# Patient Record
Sex: Female | Born: 1995 | Race: White | Hispanic: No | Marital: Single | Smoking: Never smoker
Health system: Southern US, Community
[De-identification: ages and names within clinical notes are randomized; demographics above are authoritative.]

## PROBLEM LIST (undated history)

## (undated) HISTORY — PX: WISDOM TOOTH EXTRACTION: SHX21

---
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
## 2015-11-24 ENCOUNTER — Ambulatory Visit (INDEPENDENT_AMBULATORY_CARE_PROVIDER_SITE_OTHER): Payer: BLUE CROSS/BLUE SHIELD | Admitting: Women's Health

## 2015-11-24 ENCOUNTER — Encounter: Payer: Self-pay | Admitting: Women's Health

## 2015-11-24 VITALS — BP 118/80 | Ht 66.0 in | Wt 126.0 lb

## 2015-11-24 DIAGNOSIS — Z01419 Encounter for gynecological examination (general) (routine) without abnormal findings: Secondary | ICD-10-CM | POA: Diagnosis not present

## 2015-11-24 DIAGNOSIS — Z3041 Encounter for surveillance of contraceptive pills: Secondary | ICD-10-CM

## 2015-11-24 LAB — CBC WITH DIFFERENTIAL/PLATELET
BASOS ABS: 73 {cells}/uL (ref 0–200)
Basophils Relative: 1 %
Eosinophils Absolute: 146 cells/uL (ref 15–500)
Eosinophils Relative: 2 %
HCT: 39.5 % (ref 35.0–45.0)
Hemoglobin: 13.3 g/dL (ref 11.7–15.5)
Lymphocytes Relative: 46 %
Lymphs Abs: 3358 cells/uL (ref 850–3900)
MCH: 29.9 pg (ref 27.0–33.0)
MCHC: 33.7 g/dL (ref 32.0–36.0)
MCV: 88.8 fL (ref 80.0–100.0)
MONOS PCT: 7 %
MPV: 8.4 fL (ref 7.5–12.5)
Monocytes Absolute: 511 cells/uL (ref 200–950)
Neutro Abs: 3212 cells/uL (ref 1500–7800)
Neutrophils Relative %: 44 %
PLATELETS: 427 10*3/uL — AB (ref 140–400)
RBC: 4.45 MIL/uL (ref 3.80–5.10)
RDW: 13 % (ref 11.0–15.0)
WBC: 7.3 10*3/uL (ref 3.8–10.8)

## 2015-11-24 MED ORDER — NORETHIN ACE-ETH ESTRAD-FE 1-20 MG-MCG PO TABS: 1.0000 | ORAL_TABLET | Freq: Every day | ORAL | Status: DC

## 2015-11-24 NOTE — Progress Notes (Deleted)
Terri Hurst October 15, 1995 [id number]    History:    Presents for annual exam.  ***  Past medical history, past surgical history, family history and social history were all reviewed and documented in the EPIC chart.  ROS:  A ROS was performed and pertinent positives and negatives are included.  Exam:  Filed Vitals:   11/24/15 0815  BP: 118/80    General appearance:  Normal Thyroid:  Symmetrical, normal in size, without palpable masses or nodularity. Respiratory  Auscultation:  Clear without wheezing or rhonchi Cardiovascular  Auscultation:  Regular rate, without rubs, murmurs or gallops  Edema/varicosities:  Not grossly evident Abdominal  Soft,nontender, without masses, guarding or rebound.  Liver/spleen:  No organomegaly noted  Hernia:  None appreciated  Skin  Inspection:  Grossly normal   Breasts: Examined lying and sitting.     Right: Without masses, retractions, discharge or axillary adenopathy.     Left: Without masses, retractions, discharge or axillary adenopathy. Gentitourinary   Inguinal/mons:  Normal without inguinal adenopathy  External genitalia:  Normal  BUS/Urethra/Skene's glands:  Normal  Vagina:  Normal  Cervix:  Normal  Uterus:  ***verted, normal in size, shape and contour.  Midline and mobile  Adnexa/parametria:     Rt: Without masses or tenderness.   Lt: Without masses or tenderness.  Anus and perineum: Normal  Digital rectal exam: Normal sphincter tone without palpated masses or tenderness  Assessment/Plan:  20 y.o.  for annual exam.   ***.    Harrington ChallengerYOUNG,NANCY J [facility]NP, 8:41 AM 11/24/2015

## 2015-11-24 NOTE — Patient Instructions (Signed)
Health Maintenance, Female Adopting a healthy lifestyle and getting preventive care can go a long way to promote health and wellness. Talk with your health care provider about what schedule of regular examinations is right for you. This is a good chance for you to check in with your provider about disease prevention and staying healthy. In between checkups, there are plenty of things you can do on your own. Experts have done a lot of research about which lifestyle changes and preventive measures are most likely to keep you healthy. Ask your health care provider for more information. WEIGHT AND DIET  Eat a healthy diet  Be sure to include plenty of vegetables, fruits, low-fat dairy products, and lean protein.  Do not eat a lot of foods high in solid fats, added sugars, or salt.  Get regular exercise. This is one of the most important things you can do for your health.  Most adults should exercise for at least 150 minutes each week. The exercise should increase your heart rate and make you sweat (moderate-intensity exercise).  Most adults should also do strengthening exercises at least twice a week. This is in addition to the moderate-intensity exercise.  Maintain a healthy weight  Body mass index (BMI) is a measurement that can be used to identify possible weight problems. It estimates body fat based on height and weight. Your health care provider can help determine your BMI and help you achieve or maintain a healthy weight.  For females 62 years of age and older:   A BMI below 18.5 is considered underweight.  A BMI of 18.5 to 24.9 is normal.  A BMI of 25 to 29.9 is considered overweight.  A BMI of 30 and above is considered obese.  Watch levels of cholesterol and blood lipids  You should start having your blood tested for lipids and cholesterol at 20 years of age, then have this test every 5 years.  You may need to have your cholesterol levels checked more often if:  Your lipid  or cholesterol levels are high.  You are older than 20 years of age.  You are at high risk for heart disease.  CANCER SCREENING   Lung Cancer  Lung cancer screening is recommended for adults 59-26 years old who are at high risk for lung cancer because of a history of smoking.  A yearly low-dose CT scan of the lungs is recommended for people who:  Currently smoke.  Have quit within the past 15 years.  Have at least a 30-pack-year history of smoking. A pack year is smoking an average of one pack of cigarettes a day for 1 year.  Yearly screening should continue until it has been 15 years since you quit.  Yearly screening should stop if you develop a health problem that would prevent you from having lung cancer treatment.  Breast Cancer  Practice breast self-awareness. This means understanding how your breasts normally appear and feel.  It also means doing regular breast self-exams. Let your health care provider know about any changes, no matter how small.  If you are in your 20s or 30s, you should have a clinical breast exam (CBE) by a health care provider every 1-3 years as part of a regular health exam.  If you are 95 or older, have a CBE every year. Also consider having a breast X-ray (mammogram) every year.  If you have a family history of breast cancer, talk to your health care provider about genetic screening.  If you  are at high risk for breast cancer, talk to your health care provider about having an MRI and a mammogram every year.  Breast cancer gene (BRCA) assessment is recommended for women who have family members with BRCA-related cancers. BRCA-related cancers include:  Breast.  Ovarian.  Tubal.  Peritoneal cancers.  Results of the assessment will determine the need for genetic counseling and BRCA1 and BRCA2 testing. Cervical Cancer Your health care provider may recommend that you be screened regularly for cancer of the pelvic organs (ovaries, uterus, and  vagina). This screening involves a pelvic examination, including checking for microscopic changes to the surface of your cervix (Pap test). You may be encouraged to have this screening done every 3 years, beginning at age 69.  For women ages 26-65, health care providers may recommend pelvic exams and Pap testing every 3 years, or they may recommend the Pap and pelvic exam, combined with testing for human papilloma virus (HPV), every 5 years. Some types of HPV increase your risk of cervical cancer. Testing for HPV may also be done on women of any age with unclear Pap test results.  Other health care providers may not recommend any screening for nonpregnant women who are considered low risk for pelvic cancer and who do not have symptoms. Ask your health care provider if a screening pelvic exam is right for you.  If you have had past treatment for cervical cancer or a condition that could lead to cancer, you need Pap tests and screening for cancer for at least 20 years after your treatment. If Pap tests have been discontinued, your risk factors (such as having a new sexual partner) need to be reassessed to determine if screening should resume. Some women have medical problems that increase the chance of getting cervical cancer. In these cases, your health care provider may recommend more frequent screening and Pap tests. Colorectal Cancer  This type of cancer can be detected and often prevented.  Routine colorectal cancer screening usually begins at 20 years of age and continues through 20 years of age.  Your health care provider may recommend screening at an earlier age if you have risk factors for colon cancer.  Your health care provider may also recommend using home test kits to check for hidden blood in the stool.  A small camera at the end of a tube can be used to examine your colon directly (sigmoidoscopy or colonoscopy). This is done to check for the earliest forms of colorectal  cancer.  Routine screening usually begins at age 51.  Direct examination of the colon should be repeated every 5-10 years through 20 years of age. However, you may need to be screened more often if early forms of precancerous polyps or small growths are found. Skin Cancer  Check your skin from head to toe regularly.  Tell your health care provider about any new moles or changes in moles, especially if there is a change in a mole's shape or color.  Also tell your health care provider if you have a mole that is larger than the size of a pencil eraser.  Always use sunscreen. Apply sunscreen liberally and repeatedly throughout the day.  Protect yourself by wearing long sleeves, pants, a wide-brimmed hat, and sunglasses whenever you are outside. HEART DISEASE, DIABETES, AND HIGH BLOOD PRESSURE   High blood pressure causes heart disease and increases the risk of stroke. High blood pressure is more likely to develop in:  People who have blood pressure in the high end  of the normal range (130-139/85-89 mm Hg).  People who are overweight or obese.  People who are African American.  If you are 38-23 years of age, have your blood pressure checked every 3-5 years. If you are 61 years of age or older, have your blood pressure checked every year. You should have your blood pressure measured twice--once when you are at a hospital or clinic, and once when you are not at a hospital or clinic. Record the average of the two measurements. To check your blood pressure when you are not at a hospital or clinic, you can use:  An automated blood pressure machine at a pharmacy.  A home blood pressure monitor.  If you are between 45 years and 39 years old, ask your health care provider if you should take aspirin to prevent strokes.  Have regular diabetes screenings. This involves taking a blood sample to check your fasting blood sugar level.  If you are at a normal weight and have a low risk for diabetes,  have this test once every three years after 20 years of age.  If you are overweight and have a high risk for diabetes, consider being tested at a younger age or more often. PREVENTING INFECTION  Hepatitis B  If you have a higher risk for hepatitis B, you should be screened for this virus. You are considered at high risk for hepatitis B if:  You were born in a country where hepatitis B is common. Ask your health care provider which countries are considered high risk.  Your parents were born in a high-risk country, and you have not been immunized against hepatitis B (hepatitis B vaccine).  You have HIV or AIDS.  You use needles to inject street drugs.  You live with someone who has hepatitis B.  You have had sex with someone who has hepatitis B.  You get hemodialysis treatment.  You take certain medicines for conditions, including cancer, organ transplantation, and autoimmune conditions. Hepatitis C  Blood testing is recommended for:  Everyone born from [birth year] through 1965.  Anyone with known risk factors for hepatitis C. Sexually transmitted infections (STIs)  You should be screened for sexually transmitted infections (STIs) including gonorrhea and chlamydia if:  You are sexually active and are younger than 20 years of age.  You are older than 20 years of age and your health care provider tells you that you are at risk for this type of infection.  Your sexual activity has changed since you were last screened and you are at an increased risk for chlamydia or gonorrhea. Ask your health care provider if you are at risk.  If you do not have HIV, but are at risk, it may be recommended that you take a prescription medicine daily to prevent HIV infection. This is called pre-exposure prophylaxis (PrEP). You are considered at risk if:  You are sexually active and do not regularly use condoms or know the HIV status of your partner(s).  You take drugs by injection.  You are sexually  active with a partner who has HIV. Talk with your health care provider about whether you are at high risk of being infected with HIV. If you choose to begin PrEP, you should first be tested for HIV. You should then be tested every 3 months for as long as you are taking PrEP.  PREGNANCY   If you are premenopausal and you may become pregnant, ask your health care provider about preconception counseling.  If you may  become pregnant, take 400 to 800 micrograms (mcg) of folic acid every day.  If you want to prevent pregnancy, talk to your health care provider about birth control (contraception). OSTEOPOROSIS AND MENOPAUSE   Osteoporosis is a disease in which the bones lose minerals and strength with aging. This can result in serious bone fractures. Your risk for osteoporosis can be identified using a bone density scan.  If you are 28 years of age or older, or if you are at risk for osteoporosis and fractures, ask your health care provider if you should be screened.  Ask your health care provider whether you should take a calcium or vitamin D supplement to lower your risk for osteoporosis.  Menopause may have certain physical symptoms and risks.  Hormone replacement therapy may reduce some of these symptoms and risks. Talk to your health care provider about whether hormone replacement therapy is right for you.  HOME CARE INSTRUCTIONS   Schedule regular health, dental, and eye exams.  Stay current with your immunizations.   Do not use any tobacco products including cigarettes, chewing tobacco, or electronic cigarettes.  If you are pregnant, do not drink alcohol.  If you are breastfeeding, limit how much and how often you drink alcohol.  Limit alcohol intake to no more than 1 drink per day for nonpregnant women. One drink equals 12 ounces of beer, 5 ounces of wine, or 1 ounces of hard liquor.  Do not use street drugs.  Do not share needles.  Ask your health care provider for help if  you need support or information about quitting drugs.  Tell your health care provider if you often feel depressed.  Tell your health care provider if you have ever been abused or do not feel safe at home.   This information is not intended to replace advice given to you by your health care provider. Make sure you discuss any questions you have with your health care provider.   Document Released: 02/15/2011 Document Revised: 08/23/2014 Document Reviewed: 07/04/2013 Elsevier Interactive Patient Education [employer].

## 2015-11-24 NOTE — Progress Notes (Signed)
Terri GeneralMegan N Hurst 06-Sep-1995 [id number]    History:    Presents for annual exam.  Monthly Cycle on Loestrin without complaint. 2015 right breast fibroadenoma, initially 4 cm, that has gotten smaller, confirmed on ultrasound. First partner both. Gardasil series completed. Treated for UTI 2 weeks ago.  Past medical history, past surgical history, family history and social history were all reviewed and documented in the EPIC chart. [employer] doing well, [employer] is goal. Parents healthy.  ROS:  A ROS was performed and pertinent positives and negatives are included.  Exam:  Filed Vitals:   11/24/15 0815  BP: 118/80    Hurst appearance:  Normal Thyroid:  Symmetrical, normal in size, without palpable masses or nodularity. Respiratory  Auscultation:  Clear without wheezing or rhonchi Cardiovascular  Auscultation:  Regular rate, without rubs, murmurs or gallops  Edema/varicosities:  Not grossly evident Abdominal  Soft,nontender, without masses, guarding or rebound.  Liver/spleen:  No organomegaly noted  Hernia:  None appreciated  Skin  Inspection:  Grossly normal   Breasts: Examined lying and sitting.     Right: Right mobile 3 cm nontender fibroadenoma, discharge or axillary adenopathy.     Left: Without masses, retractions, discharge or axillary adenopathy. Gentitourinary   Inguinal/mons:  Normal without inguinal adenopathy  External genitalia:  Normal  BUS/Urethra/Skene's glands:  Normal  Vagina:  Normal  Cervix:  Normal  Uterus:  normal in size, shape and contour.  Midline and mobile  Adnexa/parametria:     Rt: Without masses or tenderness.   Lt: Without masses or tenderness.  Anus and perineum: Normal    Assessment/Plan:  20 y.o. S WF G0  for annual exam with no complaints.  Monthly cycle on Loestrin/same partner Right breast fibroadenoma /getting smaller  Plan: Loestrin 1/20 prescription, proper use given and reviewed slight risk for blood clots. Continue condoms  until permanent partner. SBE's, call or return to office if changes, calcium rich diet, exercise, MVI daily encouraged. Campus safety reviewed. CBC, UHarrington Hurst.   Hurst,Terri J Kings Daughters Medical CenterWHNP, 8:49 AM 11/24/2015

## 2015-12-05 ENCOUNTER — Other Ambulatory Visit: Payer: Self-pay | Admitting: Women's Health

## 2016-02-05 ENCOUNTER — Encounter: Payer: Self-pay | Admitting: Women's Health

## 2016-02-05 ENCOUNTER — Ambulatory Visit (INDEPENDENT_AMBULATORY_CARE_PROVIDER_SITE_OTHER): Payer: BLUE CROSS/BLUE SHIELD | Admitting: Women's Health

## 2016-02-05 VITALS — BP 117/82

## 2016-02-05 DIAGNOSIS — Z113 Encounter for screening for infections with a predominantly sexual mode of transmission: Secondary | ICD-10-CM | POA: Diagnosis not present

## 2016-02-05 DIAGNOSIS — N760 Acute vaginitis: Secondary | ICD-10-CM

## 2016-02-05 DIAGNOSIS — B9689 Other specified bacterial agents as the cause of diseases classified elsewhere: Secondary | ICD-10-CM

## 2016-02-05 DIAGNOSIS — A499 Bacterial infection, unspecified: Secondary | ICD-10-CM

## 2016-02-05 LAB — WET PREP FOR TRICH, YEAST, CLUE
TRICH WET PREP: NONE SEEN
YEAST WET PREP: NONE SEEN

## 2016-02-05 MED ORDER — METRONIDAZOLE 500 MG PO TABS: 500.0000 mg | ORAL_TABLET | Freq: Two times a day (BID) | ORAL | Status: DC

## 2016-02-05 NOTE — Progress Notes (Signed)
Patient ID: Emeline GeneralMegan N Spraker, female   DOB: 12-05-1995, 20 y.o.   MRN: [id number]  Presents with complaints of intermittent white/yellow discharge for one month that has worsened over the past week, odor x1week.  Denies abdominal pain, vaginal itching, or urinary symptoms.  New partner during past month, reports symptoms began prior to new partner.   Monthly cycles on Loestrin/condoms.    Exam: Well-appearing   External genitalia within normal limits.  Speculum exam no erythema, white discharge present.  Wet prep positive for few clue cells, many WBCs, many bacteria. GC/Chlamydia culture taken.. No CMT or tenderness with exam  Bacterial vaginosis STD screen  Plan: Metronidazole 500mg  one tablet po x7days, alcohol precautions given.  Reviewed infection prevention measures.  HIV, hepatitis B and C, RPR. GC/Chlamydia culture pending. Reviewed importance of condoms until permanent partner.

## 2016-02-05 NOTE — Patient Instructions (Signed)

## 2016-02-06 ENCOUNTER — Other Ambulatory Visit: Payer: Self-pay | Admitting: Women's Health

## 2016-02-06 LAB — GC/CHLAMYDIA PROBE AMP
CT Probe RNA: DETECTED — AB
GC PROBE AMP APTIMA: NOT DETECTED

## 2016-02-06 LAB — RPR

## 2016-02-06 LAB — HEPATITIS C ANTIBODY: HCV AB: NEGATIVE

## 2016-02-06 LAB — HEPATITIS B SURFACE ANTIGEN: HEP B S AG: NEGATIVE

## 2016-02-06 LAB — HIV ANTIBODY (ROUTINE TESTING W REFLEX): HIV 1&2 Ab, 4th Generation: NONREACTIVE

## 2016-02-06 MED ORDER — AZITHROMYCIN 500 MG PO TABS: 1000.0000 mg | ORAL_TABLET | Freq: Once | ORAL | Status: DC

## 2016-02-19 ENCOUNTER — Telehealth: Payer: Self-pay

## 2016-02-19 NOTE — Telephone Encounter (Signed)
I called patient because she has not scheduled TOC appt from [facility] on 02/06/16 she was treated.  I spoke with her and reminded her about this and she scheduled appt for 03/11/16 at 3:30pm.

## 2016-03-11 ENCOUNTER — Ambulatory Visit (INDEPENDENT_AMBULATORY_CARE_PROVIDER_SITE_OTHER): Payer: BLUE CROSS/BLUE SHIELD | Admitting: Women's Health

## 2016-03-11 ENCOUNTER — Encounter: Payer: Self-pay | Admitting: Women's Health

## 2016-03-11 DIAGNOSIS — Z113 Encounter for screening for infections with a predominantly sexual mode of transmission: Secondary | ICD-10-CM | POA: Diagnosis not present

## 2016-03-11 DIAGNOSIS — A749 Chlamydial infection, unspecified: Secondary | ICD-10-CM | POA: Diagnosis not present

## 2016-03-11 NOTE — Patient Instructions (Signed)
Chlamydia, Female Chlamydia is an infection. It is spread through sexual contact. Chlamydia can be in different areas of the body. These areas include the cervix, urethra, throat, or rectum. You may not know you have chlamydia because many people never develop the symptoms. Chlamydia is not difficult to treat once you know you have it. However, if it is left untreated, chlamydia can lead to more serious health problems.  CAUSES  Chlamydia is caused by bacteria. It is a sexually transmitted disease. It is passed from an infected partner during intimate contact. This contact could be with the genitals, mouth, or rectal area. Chlamydia can also be passed from mothers to babies during birth. SIGNS AND SYMPTOMS  There may not be any symptoms. This is often the case early in the infection. If symptoms develop, they may include:  Mild pain and discomfort when urinating.  Redness, soreness, and swelling (inflammation) of the rectum.  Vaginal discharge.  Painful intercourse.  Abdominal pain.  Bleeding between menstrual periods. DIAGNOSIS  To diagnose this infection, your health care provider will do a pelvic exam. Cultures will be taken of the vagina, cervix, urine, and possibly the rectum to verify the diagnosis.  TREATMENT You will be given antibiotic medicines. If you are pregnant, certain types of antibiotics will need to be avoided. Any sexual partners should also be treated, even if they do not show symptoms. Your health care provider may test you for infection again 3 months after treatment. HOME CARE INSTRUCTIONS   Take your antibiotic medicine as directed by your health care provider. Finish the antibiotic even if you start to feel better.  Take medicines only as directed by your health care provider.  Inform any sexual partners about the infection. They should also be treated.  Do not have sexual contact until your health care provider tells you it is okay.  Get plenty of  rest.  Eat a well-balanced diet.  Drink enough fluids to keep your urine clear or pale yellow.  Keep all follow-up visits as directed by your health care provider. SEEK MEDICAL CARE IF:  You have painful urination.  You have abdominal pain.  You have vaginal discharge.  You have painful sexual intercourse.  You have bleeding between periods and after sex.  You have a fever. SEEK IMMEDIATE MEDICAL CARE IF:   You experience nausea or vomiting.  You experience excessive sweating (diaphoresis).  You have difficulty swallowing.   This information is not intended to replace advice given to you by your health care provider. Make sure you discuss any questions you have with your health care provider.   Document Released: 05/12/2005 Document Revised: 04/23/2015 Document Reviewed: 04/09/2013 Elsevier Interactive Patient Education 2016 Elsevier Inc.  

## 2016-03-11 NOTE — Progress Notes (Signed)
Presents for test of cure chlamydia. Diagnosed with chlamydia 02/05/2016 treated with Zithromax, has abstained, partner informed. Negative HIV, hepatitis, RPR. Contraceptives on Loestrin. Denies discharge, urinary symptoms, abdominal pain or fever.  Exam: Appears well. External genitalia within normal limits, speculum exam scant discharge no odor or erythema noted, GC/Chlamydia culture taken. Bimanual no CMT or tenderness.  Test of cure chlamydia  Plan: GC/Chlamydia culture pending. Reviewed importance of condoms until permanent partner or abstinence.

## 2016-03-12 LAB — GC/CHLAMYDIA PROBE AMP
CT Probe RNA: NOT DETECTED
GC Probe RNA: NOT DETECTED

## 2016-05-25 ENCOUNTER — Ambulatory Visit (INDEPENDENT_AMBULATORY_CARE_PROVIDER_SITE_OTHER): Payer: BLUE CROSS/BLUE SHIELD | Admitting: Women's Health

## 2016-05-25 ENCOUNTER — Other Ambulatory Visit: Payer: Self-pay | Admitting: Women's Health

## 2016-05-25 ENCOUNTER — Encounter: Payer: Self-pay | Admitting: Women's Health

## 2016-05-25 VITALS — BP 124/80 | Ht 66.0 in | Wt 126.0 lb

## 2016-05-25 DIAGNOSIS — R350 Frequency of micturition: Secondary | ICD-10-CM

## 2016-05-25 DIAGNOSIS — N898 Other specified noninflammatory disorders of vagina: Secondary | ICD-10-CM

## 2016-05-25 DIAGNOSIS — N3000 Acute cystitis without hematuria: Secondary | ICD-10-CM

## 2016-05-25 LAB — URINALYSIS W MICROSCOPIC + REFLEX CULTURE
Bilirubin Urine: NEGATIVE
CASTS: NONE SEEN [LPF]
CRYSTALS: NONE SEEN [HPF]
Glucose, UA: NEGATIVE
Ketones, ur: NEGATIVE
NITRITE: NEGATIVE
PROTEIN: NEGATIVE
SPECIFIC GRAVITY, URINE: 1.02 (ref 1.001–1.035)
YEAST: NONE SEEN [HPF]
pH: 7 (ref 5.0–8.0)

## 2016-05-25 LAB — WET PREP FOR TRICH, YEAST, CLUE
Clue Cells Wet Prep HPF POC: NONE SEEN
TRICH WET PREP: NONE SEEN
YEAST WET PREP: NONE SEEN

## 2016-05-25 MED ORDER — SULFAMETHOXAZOLE-TRIMETHOPRIM 800-160 MG PO TABS: 1.0000 | ORAL_TABLET | Freq: Two times a day (BID) | ORAL | 0 refills | Status: DC

## 2016-05-25 NOTE — Patient Instructions (Signed)

## 2016-05-25 NOTE — Progress Notes (Signed)
Presents with complaint of increased pain and burning with urination, increased symptoms at end of stream of urination but is not always for the past week, Increased symptoms the longer she postpones urination with a full bladder. Denies nocturia, nausea, fever, or change in discharge. Same partner with negative STD screen. Contraceptives on Loestrin.  Exam: Appears well. UA: Trace blood, trace leukocytes, 10-20 WBCs, 6-10 squamous epithelials moderate bacteria. External genitalia within normal limits, speculum exam moderate amount of white discharge without erythema or odor, wet prep negative.  Probable UTI  Plan: Septra twice daily for 3 days #6, prescription, proper use given and reviewed. Urine culture pending. Instructed to call if no relief of symptoms. UTI prevention discussed.

## 2016-05-27 LAB — URINE CULTURE: Colony Count: >=100000

## 2016-10-13 ENCOUNTER — Other Ambulatory Visit: Payer: Self-pay | Admitting: Women's Health

## 2016-12-29 ENCOUNTER — Encounter: Payer: Self-pay | Admitting: Gynecology

## 2017-01-01 ENCOUNTER — Other Ambulatory Visit: Payer: Self-pay | Admitting: Women's Health

## 2017-01-03 ENCOUNTER — Telehealth: Payer: Self-pay | Admitting: *Deleted

## 2017-01-03 NOTE — Telephone Encounter (Signed)
Pt called stating she doesn't like new generic pills prescribed, has annual scheduled on 01/11/17. Will discuss with nancy at this appointment about switching pills.

## 2017-01-11 ENCOUNTER — Encounter: Payer: BLUE CROSS/BLUE SHIELD | Admitting: Women's Health

## 2017-01-11 DIAGNOSIS — Z0289 Encounter for other administrative examinations: Secondary | ICD-10-CM

## 2017-02-01 ENCOUNTER — Ambulatory Visit (INDEPENDENT_AMBULATORY_CARE_PROVIDER_SITE_OTHER): Payer: BLUE CROSS/BLUE SHIELD | Admitting: Women's Health

## 2017-02-01 ENCOUNTER — Encounter: Payer: Self-pay | Admitting: Women's Health

## 2017-02-01 VITALS — BP 100/60 | Ht 66.75 in | Wt 125.0 lb

## 2017-02-01 DIAGNOSIS — Z113 Encounter for screening for infections with a predominantly sexual mode of transmission: Secondary | ICD-10-CM | POA: Diagnosis not present

## 2017-02-01 DIAGNOSIS — Z30015 Encounter for initial prescription of vaginal ring hormonal contraceptive: Secondary | ICD-10-CM | POA: Diagnosis not present

## 2017-02-01 DIAGNOSIS — Z01419 Encounter for gynecological examination (general) (routine) without abnormal findings: Secondary | ICD-10-CM | POA: Diagnosis not present

## 2017-02-01 LAB — CBC WITH DIFFERENTIAL/PLATELET
BASOS PCT: 0 %
Basophils Absolute: 0 cells/uL (ref 0–200)
Eosinophils Absolute: 210 cells/uL (ref 15–500)
Eosinophils Relative: 3 %
HCT: 39.1 % (ref 35.0–45.0)
Hemoglobin: 12.9 g/dL (ref 11.7–15.5)
LYMPHS PCT: 37 %
Lymphs Abs: 2590 cells/uL (ref 850–3900)
MCH: 29.5 pg (ref 27.0–33.0)
MCHC: 33 g/dL (ref 32.0–36.0)
MCV: 89.5 fL (ref 80.0–100.0)
MONOS PCT: 7 %
MPV: 9.1 fL (ref 7.5–12.5)
Monocytes Absolute: 490 cells/uL (ref 200–950)
NEUTROS PCT: 53 %
Neutro Abs: 3710 cells/uL (ref 1500–7800)
PLATELETS: 337 10*3/uL (ref 140–400)
RBC: 4.37 MIL/uL (ref 3.80–5.10)
RDW: 12.8 % (ref 11.0–15.0)
WBC: 7 10*3/uL (ref 3.8–10.8)

## 2017-02-01 MED ORDER — ETONOGESTREL-ETHINYL ESTRADIOL 0.12-0.015 MG/24HR VA RING: VAGINAL_RING | VAGINAL | 4 refills | Status: DC

## 2017-02-01 NOTE — Progress Notes (Signed)
Terri GeneralMegan N Borum 01/07/1996 [id number]    History:    Presents for annual exam.  Monthly cycle on Loestrin but occasionally forgetting. 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 Gardasil series completed. New partner.   Past medical history, past surgical history, family history and social history were all reviewed and documented in the EPIC chart. [employer] at [employer], [place]. Parents healthy.  ROS:  A ROS was performed and pertinent positives and negatives are included.  Exam:  Vitals:   02/01/17 1556  BP: 100/60  Weight: 125 lb (56.7 kg)  Height: 5' 6.75" (1.695 m)   Body mass index is 19.72 kg/m.   Hurst appearance:  Normal Thyroid:  Symmetrical, normal in size, without palpable masses or nodularity. Respiratory  Auscultation:  Clear without wheezing or rhonchi Cardiovascular  Auscultation:  Regular rate, without rubs, murmurs or gallops  Edema/varicosities:  Not grossly evident Abdominal  Soft,nontender, without masses, guarding or rebound.  Liver/spleen:  No organomegaly noted  Hernia:  None appreciated  Skin  Inspection:  Grossly normal   Breasts: Examined lying and sitting.     Right: Without masses, retractions, discharge or axillary adenopathy.     Left: Without masses, retractions, discharge or axillary adenopathy. Gentitourinary   Inguinal/mons:  Normal without inguinal adenopathy  External genitalia:  Normal  BUS/Urethra/Skene's glands:  Normal  Vagina:  Normal  Cervix:  Normal  Uterus:  normal in size, shape and contour.  Midline and mobile  Adnexa/parametria:     Rt: Without masses or tenderness.   Lt: Without masses or tenderness.  Anus and perineum: Normal  Assessment/Plan:  21 y.o. S WF G0  for annual exam with no complaints.  Contraception management STD screen Right breast fibroadenoma/shrinking  Plan: Contraception options reviewed will try NuvaRing prescription, proper use, slight risk for blood clots and strokes reviewed.  Instructed to call if problems with change. Aware that it is effective for up to 4 weeks. SBE's, exercise, calcium rich diet, MVI daily encouraged. Campus and dating safety reviewed. CBC, GC/Chlamydia, HIV, hep B, C, RPR.  Harrington Challengerancy J Young [facility] SystemWHNP, 5:08 PM 02/01/2017

## 2017-02-01 NOTE — Patient Instructions (Signed)

## 2017-02-02 ENCOUNTER — Other Ambulatory Visit: Payer: Self-pay | Admitting: Women's Health

## 2017-02-02 LAB — GC/CHLAMYDIA PROBE AMP
CT PROBE, AMP APTIMA: DETECTED — AB
GC Probe RNA: NOT DETECTED

## 2017-02-02 LAB — HEPATITIS B SURFACE ANTIGEN: Hepatitis B Surface Ag: NEGATIVE

## 2017-02-02 LAB — RPR

## 2017-02-02 LAB — HIV ANTIBODY (ROUTINE TESTING W REFLEX): HIV: NONREACTIVE

## 2017-02-02 LAB — HEPATITIS C ANTIBODY: HCV AB: NEGATIVE

## 2017-02-02 MED ORDER — AZITHROMYCIN 500 MG PO TABS: 1000.0000 mg | ORAL_TABLET | Freq: Every day | ORAL | 0 refills | Status: DC

## 2017-02-08 ENCOUNTER — Encounter: Payer: BLUE CROSS/BLUE SHIELD | Admitting: Women's Health

## 2017-02-22 ENCOUNTER — Ambulatory Visit: Payer: BLUE CROSS/BLUE SHIELD | Admitting: Gynecology

## 2017-03-15 ENCOUNTER — Ambulatory Visit: Payer: BLUE CROSS/BLUE SHIELD | Admitting: Women's Health

## 2017-05-25 DIAGNOSIS — F4310 Post-traumatic stress disorder, unspecified: Secondary | ICD-10-CM | POA: Diagnosis not present

## 2017-06-07 DIAGNOSIS — F4310 Post-traumatic stress disorder, unspecified: Secondary | ICD-10-CM | POA: Diagnosis not present

## 2017-06-16 DIAGNOSIS — F4310 Post-traumatic stress disorder, unspecified: Secondary | ICD-10-CM | POA: Diagnosis not present

## 2017-06-16 DIAGNOSIS — F4323 Adjustment disorder with mixed anxiety and depressed mood: Secondary | ICD-10-CM | POA: Diagnosis not present

## 2017-07-08 DIAGNOSIS — J029 Acute pharyngitis, unspecified: Secondary | ICD-10-CM | POA: Diagnosis not present

## 2017-08-01 ENCOUNTER — Telehealth: Payer: Self-pay | Admitting: *Deleted

## 2017-08-01 NOTE — Telephone Encounter (Signed)
Spoke with patient c/o vaginal discharge and vaginal itching and irritation. Transferred to front desk to schedule OV with provider.

## 2017-08-01 NOTE — Telephone Encounter (Signed)
Patient called left message in triage voicemail c/o vaginal itching, asked me to call her. I called and told pt to call me back.

## 2017-08-03 ENCOUNTER — Encounter: Payer: Self-pay | Admitting: Women's Health

## 2017-08-03 ENCOUNTER — Ambulatory Visit (INDEPENDENT_AMBULATORY_CARE_PROVIDER_SITE_OTHER): Payer: BLUE CROSS/BLUE SHIELD | Admitting: Women's Health

## 2017-08-03 VITALS — BP 110/78

## 2017-08-03 DIAGNOSIS — R300 Dysuria: Secondary | ICD-10-CM | POA: Diagnosis not present

## 2017-08-03 DIAGNOSIS — B373 Candidiasis of vulva and vagina: Secondary | ICD-10-CM

## 2017-08-03 DIAGNOSIS — B3731 Acute candidiasis of vulva and vagina: Secondary | ICD-10-CM

## 2017-08-03 LAB — WET PREP FOR TRICH, YEAST, CLUE

## 2017-08-03 MED ORDER — FLUCONAZOLE 150 MG PO TABS: 150.0000 mg | ORAL_TABLET | Freq: Once | ORAL | 1 refills | Status: AC

## 2017-08-03 NOTE — Patient Instructions (Signed)

## 2017-08-03 NOTE — Progress Notes (Signed)
21 year old S WF G0 presents with complaint of vaginal irritation with itching and discomfort for the past few days. Was treated for strep throat with amoxicillin several weeks ago at primary care. Mild urinary burning, frequency without pain at end of stream of urination. Denies abdominal pain or fever. Monthly cycle on NuvaRing. Not sexually active, negative STD screen with past partner. [employer] hoping to graduate in May.  Exam: Appears well. External genitalia extremely erythematous at introitus, speculum exam vaginal walls erythematous, moderate amount of a curdy discharge noted, wet prep positive for yeast. UA: Negative nitrites, negative leukocytes, negative blood, 0-5 WBCs, few bacteria.  Yeast vaginitis  Plan: Diflucan 150 by mouth 1 dose, 1 refill, yeast prevention discussed, instructed to call if no relief of symptoms.

## 2017-08-04 LAB — URINALYSIS W MICROSCOPIC + REFLEX CULTURE
Bilirubin Urine: NEGATIVE
Glucose, UA: NEGATIVE
HGB URINE DIPSTICK: NEGATIVE
HYALINE CAST: NONE SEEN /LPF
KETONES UR: NEGATIVE
LEUKOCYTE ESTERASE: NEGATIVE
Nitrites, Initial: NEGATIVE
PROTEIN: NEGATIVE
RBC / HPF: NONE SEEN /HPF (ref 0–2)
Specific Gravity, Urine: 1.022 (ref 1.001–1.03)
pH: 5.5 (ref 5.0–8.0)

## 2017-08-04 LAB — NO CULTURE INDICATED

## 2018-02-06 ENCOUNTER — Encounter: Payer: BLUE CROSS/BLUE SHIELD | Admitting: Women's Health

## 2018-02-06 DIAGNOSIS — Z0289 Encounter for other administrative examinations: Secondary | ICD-10-CM

## 2018-03-09 ENCOUNTER — Other Ambulatory Visit: Payer: Self-pay | Admitting: Women's Health

## 2018-03-09 DIAGNOSIS — Z30015 Encounter for initial prescription of vaginal ring hormonal contraceptive: Secondary | ICD-10-CM

## 2018-04-12 ENCOUNTER — Ambulatory Visit (INDEPENDENT_AMBULATORY_CARE_PROVIDER_SITE_OTHER): Payer: BLUE CROSS/BLUE SHIELD | Admitting: Women's Health

## 2018-04-12 ENCOUNTER — Encounter: Payer: Self-pay | Admitting: Women's Health

## 2018-04-12 ENCOUNTER — Other Ambulatory Visit: Payer: Self-pay | Admitting: Women's Health

## 2018-04-12 VITALS — BP 120/78

## 2018-04-12 DIAGNOSIS — N3000 Acute cystitis without hematuria: Secondary | ICD-10-CM | POA: Diagnosis not present

## 2018-04-12 DIAGNOSIS — Z30015 Encounter for initial prescription of vaginal ring hormonal contraceptive: Secondary | ICD-10-CM | POA: Diagnosis not present

## 2018-04-12 DIAGNOSIS — R300 Dysuria: Secondary | ICD-10-CM | POA: Diagnosis not present

## 2018-04-12 MED ORDER — SULFAMETHOXAZOLE-TRIMETHOPRIM 800-160 MG PO TABS: 1.0000 | ORAL_TABLET | Freq: Two times a day (BID) | ORAL | 0 refills | Status: DC

## 2018-04-12 MED ORDER — ETONOGESTREL-ETHINYL ESTRADIOL 0.12-0.015 MG/24HR VA RING: VAGINAL_RING | VAGINAL | 0 refills | Status: DC

## 2018-04-12 NOTE — Patient Instructions (Signed)

## 2018-04-12 NOTE — Progress Notes (Signed)
22 year old S WF G0 presents with complaint of increased urinary frequency with urgency, burning, bladder pressure and discomfort for the last few days that is getting worse.  Denies vaginal discharge, itching or odor, changes in GI or fever.  Has not been sexually active in many months, denies need for STD screen.  Contraception on NuvaRing without complaint.  Attending school at [employer], had a semester abroad this past summer in [place].  Currently on menstrual cycle.   Exam: Appears well.  No CVAT. UA:  pH greater than 9, +1 leukocytes, +3 blood, 40-60 WBCs, greater than 60 RBCs, 6-10 squamous epithelials, many bacteria  UTI  Plan: Septra twice daily for 3 days prescription, proper use given and reviewed,  UTI prevention discussed.  Instructed to call if no relief.  Reviewed importance of increasing water.  Keep scheduled annual exam office visit.

## 2018-04-13 LAB — URINE CULTURE
MICRO NUMBER:: 91030113
SPECIMEN QUALITY:: ADEQUATE

## 2018-04-18 ENCOUNTER — Encounter: Payer: Self-pay | Admitting: Women's Health

## 2018-04-18 ENCOUNTER — Ambulatory Visit: Payer: BLUE CROSS/BLUE SHIELD | Admitting: Women's Health

## 2018-04-18 VITALS — BP 110/78 | Ht 67.0 in | Wt 130.0 lb

## 2018-04-18 DIAGNOSIS — Z113 Encounter for screening for infections with a predominantly sexual mode of transmission: Secondary | ICD-10-CM

## 2018-04-18 DIAGNOSIS — Z01419 Encounter for gynecological examination (general) (routine) without abnormal findings: Secondary | ICD-10-CM | POA: Diagnosis not present

## 2018-04-18 DIAGNOSIS — Z30015 Encounter for initial prescription of vaginal ring hormonal contraceptive: Secondary | ICD-10-CM

## 2018-04-18 MED ORDER — ETONOGESTREL-ETHINYL ESTRADIOL 0.12-0.015 MG/24HR VA RING: VAGINAL_RING | VAGINAL | 4 refills | Status: DC

## 2018-04-18 NOTE — Patient Instructions (Signed)

## 2018-04-18 NOTE — Progress Notes (Signed)
Terri Hurst [birth year] [id number]    History:    Presents for annual exam.  Light monthly cycle on NuvaRing without complaint.  Currently not sexually active but  requested STD screen.  Gardasil series completed.  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  Was treated for UTI last week and has had good symptom relief.  Past medical history, past surgical history, family history and social history were all reviewed and documented in the EPIC chart.  Senior at [employer].  Parents healthy.  ROS:  A ROS was performed and pertinent positives and negatives are included.  Exam:  Vitals:   04/18/18 1029  BP: 110/78  Weight: 130 lb (59 kg)  Height: 5\' 7"  (1.702 m)   Body mass index is 20.36 kg/m.   General appearance:  Normal Thyroid:  Symmetrical, normal in size, without palpable masses or nodularity. Respiratory  Auscultation:  Clear without wheezing or rhonchi Cardiovascular  Auscultation:  Regular rate, without rubs, murmurs or gallops  Edema/varicosities:  Not grossly evident Abdominal  Soft,nontender, without masses, guarding or rebound.  Liver/spleen:  No organomegaly noted  Hernia:  None appreciated  Skin  Inspection:  Grossly normal   Breasts: Examined lying and sitting.     Right: Without masses, retractions, discharge or axillary adenopathy.     Left: Without masses, retractions, discharge or axillary adenopathy. Gentitourinary   Inguinal/mons:  Normal without inguinal adenopathy  External genitalia:  Normal  BUS/Urethra/Skene's glands:  Normal  Vagina:  Normal  Cervix:  Normal  Uterus:  normal in size, shape and contour.  Midline and mobile  Adnexa/parametria:     Rt: Without masses or tenderness.   Lt: Without masses or tenderness.  Anus and perineum: Normal  Digital rectal exam: Normal sphincter tone without palpated masses or tenderness  Assessment/Plan:  22 y.o. S WF G0 for annual exam with no complaints.  Monthly cycle on NuvaRing 2015 right breast  fibroadenoma/smaller STD screen  Plan: NuvaRing prescription, proper use given and reviewed slight risk for blood clots and strokes.  Condoms encouraged until permanent partner.  SBE's, exercise, calcium rich foods, MVI daily encouraged.  Campus safety discussed.  CBC, GC/chlamydia, HIV, hep B, C, RPR.  Pap.    Harrington Challenger [facility], 1:15 PM 04/18/2018

## 2018-04-19 LAB — RPR: RPR: NONREACTIVE

## 2018-04-19 LAB — CBC WITH DIFFERENTIAL/PLATELET
BASOS ABS: 61 {cells}/uL (ref 0–200)
Basophils Relative: 0.8 %
EOS ABS: 122 {cells}/uL (ref 15–500)
EOS PCT: 1.6 %
HEMATOCRIT: 38.7 % (ref 35.0–45.0)
HEMOGLOBIN: 13 g/dL (ref 11.7–15.5)
LYMPHS ABS: 2082 {cells}/uL (ref 850–3900)
MCH: 30.2 pg (ref 27.0–33.0)
MCHC: 33.6 g/dL (ref 32.0–36.0)
MCV: 90 fL (ref 80.0–100.0)
MPV: 9.6 fL (ref 7.5–12.5)
Monocytes Relative: 7.5 %
NEUTROS ABS: 4765 {cells}/uL (ref 1500–7800)
Neutrophils Relative %: 62.7 %
Platelets: 314 10*3/uL (ref 140–400)
RBC: 4.3 10*6/uL (ref 3.80–5.10)
RDW: 11.9 % (ref 11.0–15.0)
Total Lymphocyte: 27.4 %
WBC mixed population: 570 cells/uL (ref 200–950)
WBC: 7.6 10*3/uL (ref 3.8–10.8)

## 2018-04-19 LAB — HEPATITIS C ANTIBODY
Hepatitis C Ab: NONREACTIVE
SIGNAL TO CUT-OFF: 0.02 (ref <1.00)

## 2018-04-19 LAB — PAP IG W/ RFLX HPV ASCU

## 2018-04-19 LAB — HEPATITIS B SURFACE ANTIGEN: Hepatitis B Surface Ag: NONREACTIVE

## 2018-04-19 LAB — HIV ANTIBODY (ROUTINE TESTING W REFLEX): HIV: NONREACTIVE

## 2018-04-19 LAB — C. TRACHOMATIS/N. GONORRHOEAE RNA
C. trachomatis RNA, TMA: NOT DETECTED
N. gonorrhoeae RNA, TMA: NOT DETECTED

## 2018-05-08 LAB — URINALYSIS, COMPLETE W/RFL CULTURE
BILIRUBIN URINE: NEGATIVE
GLUCOSE, UA: NEGATIVE
Hyaline Cast: NONE SEEN /LPF
Ketones, ur: NEGATIVE
NITRITES URINE, INITIAL: NEGATIVE
RBC / HPF: >=60 /HPF — AB (ref 0–2)
SPECIFIC GRAVITY, URINE: 1.018 (ref 1.001–1.03)

## 2018-05-08 LAB — URINE CULTURE

## 2018-05-08 LAB — CULTURE INDICATED

## 2019-03-21 DIAGNOSIS — Z03818 Encounter for observation for suspected exposure to other biological agents ruled out: Secondary | ICD-10-CM | POA: Diagnosis not present

## 2019-03-21 DIAGNOSIS — U071 COVID-19: Secondary | ICD-10-CM | POA: Diagnosis not present

## 2019-04-17 DIAGNOSIS — U071 COVID-19: Secondary | ICD-10-CM | POA: Diagnosis not present

## 2019-04-17 DIAGNOSIS — Z03818 Encounter for observation for suspected exposure to other biological agents ruled out: Secondary | ICD-10-CM | POA: Diagnosis not present

## 2019-04-20 ENCOUNTER — Other Ambulatory Visit: Payer: Self-pay | Admitting: Women's Health

## 2019-04-20 DIAGNOSIS — Z30015 Encounter for initial prescription of vaginal ring hormonal contraceptive: Secondary | ICD-10-CM

## 2019-04-23 DIAGNOSIS — U071 COVID-19: Secondary | ICD-10-CM | POA: Diagnosis not present

## 2019-06-13 ENCOUNTER — Other Ambulatory Visit: Payer: Self-pay

## 2019-06-13 ENCOUNTER — Encounter: Payer: Self-pay | Admitting: Women's Health

## 2019-06-13 ENCOUNTER — Ambulatory Visit (INDEPENDENT_AMBULATORY_CARE_PROVIDER_SITE_OTHER): Payer: BC Managed Care – PPO | Admitting: Women's Health

## 2019-06-13 VITALS — BP 128/80 | Ht 67.0 in | Wt 127.0 lb

## 2019-06-13 DIAGNOSIS — Z01419 Encounter for gynecological examination (general) (routine) without abnormal findings: Secondary | ICD-10-CM

## 2019-06-13 DIAGNOSIS — Z30015 Encounter for initial prescription of vaginal ring hormonal contraceptive: Secondary | ICD-10-CM

## 2019-06-13 LAB — CBC WITH DIFFERENTIAL/PLATELET
Absolute Monocytes: 340 cells/uL (ref 200–950)
Basophils Absolute: 63 cells/uL (ref 0–200)
Basophils Relative: 1 %
Eosinophils Absolute: 132 cells/uL (ref 15–500)
Eosinophils Relative: 2.1 %
HCT: 38.1 % (ref 35.0–45.0)
Hemoglobin: 12.8 g/dL (ref 11.7–15.5)
Lymphs Abs: 2419 cells/uL (ref 850–3900)
MCH: 30 pg (ref 27.0–33.0)
MCHC: 33.6 g/dL (ref 32.0–36.0)
MCV: 89.4 fL (ref 80.0–100.0)
MPV: 9.4 fL (ref 7.5–12.5)
Monocytes Relative: 5.4 %
Neutro Abs: 3345 cells/uL (ref 1500–7800)
Neutrophils Relative %: 53.1 %
Platelets: 328 10*3/uL (ref 140–400)
RBC: 4.26 10*6/uL (ref 3.80–5.10)
RDW: 11.8 % (ref 11.0–15.0)
Total Lymphocyte: 38.4 %
WBC: 6.3 10*3/uL (ref 3.8–10.8)

## 2019-06-13 MED ORDER — ETONOGESTREL-ETHINYL ESTRADIOL 0.12-0.015 MG/24HR VA RING: VAGINAL_RING | VAGINAL | 4 refills | Status: DC

## 2019-06-13 NOTE — Patient Instructions (Signed)
It was good seeing you! Health Maintenance, Female Adopting a healthy lifestyle and getting preventive care are important in promoting health and wellness. Ask your health care provider about:  The right schedule for you to have regular tests and exams.  Things you can do on your own to prevent diseases and keep yourself healthy. What should I know about diet, weight, and exercise? Eat a healthy diet   Eat a diet that includes plenty of vegetables, fruits, low-fat dairy products, and lean protein.  Do not eat a lot of foods that are high in solid fats, added sugars, or sodium. Maintain a healthy weight Body mass index (BMI) is used to identify weight problems. It estimates body fat based on height and weight. Your health care provider can help determine your BMI and help you achieve or maintain a healthy weight. Get regular exercise Get regular exercise. This is one of the most important things you can do for your health. Most adults should:  Exercise for at least 150 minutes each week. The exercise should increase your heart rate and make you sweat (moderate-intensity exercise).  Do strengthening exercises at least twice a week. This is in addition to the moderate-intensity exercise.  Spend less time sitting. Even light physical activity can be beneficial. Watch cholesterol and blood lipids Have your blood tested for lipids and cholesterol at 23 years of age, then have this test every 5 years. Have your cholesterol levels checked more often if:  Your lipid or cholesterol levels are high.  You are older than 23 years of age.  You are at high risk for heart disease. What should I know about cancer screening? Depending on your health history and family history, you may need to have cancer screening at various ages. This may include screening for:  Breast cancer.  Cervical cancer.  Colorectal cancer.  Skin cancer.  Lung cancer. What should I know about heart disease,  diabetes, and high blood pressure? Blood pressure and heart disease  High blood pressure causes heart disease and increases the risk of stroke. This is more likely to develop in people who have high blood pressure readings, are of African descent, or are overweight.  Have your blood pressure checked: ? Every 3-5 years if you are 18-39 years of age. ? Every year if you are 40 years old or older. Diabetes Have regular diabetes screenings. This checks your fasting blood sugar level. Have the screening done:  Once every three years after age 40 if you are at a normal weight and have a low risk for diabetes.  More often and at a younger age if you are overweight or have a high risk for diabetes. What should I know about preventing infection? Hepatitis B If you have a higher risk for hepatitis B, you should be screened for this virus. Talk with your health care provider to find out if you are at risk for hepatitis B infection. Hepatitis C Testing is recommended for:  Everyone born from 1945 through 1965.  Anyone with known risk factors for hepatitis C. Sexually transmitted infections (STIs)  Get screened for STIs, including gonorrhea and chlamydia, if: ? You are sexually active and are younger than 24 years of age. ? You are older than 24 years of age and your health care provider tells you that you are at risk for this type of infection. ? Your sexual activity has changed since you were last screened, and you are at increased risk for chlamydia or gonorrhea. Ask   your health care provider if you are at risk.  Ask your health care provider about whether you are at high risk for HIV. Your health care provider may recommend a prescription medicine to help prevent HIV infection. If you choose to take medicine to prevent HIV, you should first get tested for HIV. You should then be tested every 3 months for as long as you are taking the medicine. Pregnancy  If you are about to stop having your  period (premenopausal) and you may become pregnant, seek counseling before you get pregnant.  Take 400 to 800 micrograms (mcg) of folic acid every day if you become pregnant.  Ask for birth control (contraception) if you want to prevent pregnancy. Osteoporosis and menopause Osteoporosis is a disease in which the bones lose minerals and strength with aging. This can result in bone fractures. If you are 65 years old or older, or if you are at risk for osteoporosis and fractures, ask your health care provider if you should:  Be screened for bone loss.  Take a calcium or vitamin D supplement to lower your risk of fractures.  Be given hormone replacement therapy (HRT) to treat symptoms of menopause. Follow these instructions at home: Lifestyle  Do not use any products that contain nicotine or tobacco, such as cigarettes, e-cigarettes, and chewing tobacco. If you need help quitting, ask your health care provider.  Do not use street drugs.  Do not share needles.  Ask your health care provider for help if you need support or information about quitting drugs. Alcohol use  Do not drink alcohol if: ? Your health care provider tells you not to drink. ? You are pregnant, may be pregnant, or are planning to become pregnant.  If you drink alcohol: ? Limit how much you use to 0-1 drink a day. ? Limit intake if you are breastfeeding.  Be aware of how much alcohol is in your drink. In the [place] one drink equals one 12 oz bottle of beer (355 mL), one 5 oz glass of wine (148 mL), or one 1 oz glass of hard liquor (44 mL). General instructions  Schedule regular health, dental, and eye exams.  Stay current with your vaccines.  Tell your health care provider if: ? You often feel depressed. ? You have ever been abused or do not feel safe at home. Summary  Adopting a healthy lifestyle and getting preventive care are important in promoting health and wellness.  Follow your health care provider's  instructions about healthy diet, exercising, and getting tested or screened for diseases.  Follow your health care provider's instructions on monitoring your cholesterol and blood pressure. This information is not intended to replace advice given to you by your health care provider. Make sure you discuss any questions you have with your health care provider. Document Released: 02/15/2011 Document Revised: 07/26/2018 Document Reviewed: 07/26/2018 Elsevier Patient Education  2020 Elsevier Inc.  

## 2019-06-13 NOTE — Progress Notes (Signed)
Terri Hurst 1996/03/07 [id number]    History:    Presents for annual exam.  Monthly cycle on NuvaRing without complaint.  Same partner with negative STD screen.  Gardasil series completed.  History of a right breast fibroadenoma that has gotten smaller.  2018+ chlamydia, negative test of cure  Past medical history, past surgical history, family history and social history were all reviewed and documented in the EPIC chart.  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  Parents healthy.  Has a puppy Lyla which she is enjoying training.  ROS:  A ROS was performed and pertinent positives and negatives are included.  Exam:  Vitals:   06/13/19 1606  BP: 128/80  Weight: 127 lb (57.6 kg)  Height: 5\' 7"  (1.702 m)   Body mass index is 19.89 kg/m.   General appearance:  Normal Thyroid:  Symmetrical, normal in size, without palpable masses or nodularity. Respiratory  Auscultation:  Clear without wheezing or rhonchi Cardiovascular  Auscultation:  Regular rate, without rubs, murmurs or gallops  Edema/varicosities:  Not grossly evident Abdominal  Soft,nontender, without masses, guarding or rebound.  Liver/spleen:  No organomegaly noted  Hernia:  None appreciated  Skin  Inspection:  Grossly normal   Breasts: Examined lying and sitting.     Right: Without masses, retractions, discharge or axillary adenopathy.     Left: Without masses, retractions, discharge or axillary adenopathy. Gentitourinary   Inguinal/mons:  Normal without inguinal adenopathy  External genitalia:  Normal  BUS/Urethra/Skene's glands:  Normal  Vagina:  Normal  Cervix:  Normal  Uterus:  normal in size, shape and contour.  Midline and mobile  Adnexa/parametria:     Rt: Without masses or tenderness.   Lt: Without masses or tenderness.  Anus and perineum: Normal   Assessment/Plan:  23 y.o. S WF G0 for annual exam with no complaints.  Monthly cycle on  NuvaRing  Plan: NuvaRing prescription, proper use, slight risk for blood clots and strokes reviewed.  Condoms encouraged until permanent partner.  SBEs, exercise, calcium rich foods, and MVI daily encouraged.  Pap normal 2019, new screening guidelines reviewed.  CBC.     [place], 4:47 PM 06/13/2019

## 2019-06-23 DIAGNOSIS — H109 Unspecified conjunctivitis: Secondary | ICD-10-CM | POA: Diagnosis not present

## 2019-07-03 DIAGNOSIS — Z20828 Contact with and (suspected) exposure to other viral communicable diseases: Secondary | ICD-10-CM | POA: Diagnosis not present

## 2019-07-03 DIAGNOSIS — Z03818 Encounter for observation for suspected exposure to other biological agents ruled out: Secondary | ICD-10-CM | POA: Diagnosis not present

## 2019-07-17 DIAGNOSIS — Z20818 Contact with and (suspected) exposure to other bacterial communicable diseases: Secondary | ICD-10-CM | POA: Diagnosis not present

## 2019-11-09 ENCOUNTER — Ambulatory Visit: Payer: BC Managed Care – PPO | Attending: Internal Medicine

## 2019-11-09 DIAGNOSIS — Z23 Encounter for immunization: Secondary | ICD-10-CM

## 2019-11-09 NOTE — Progress Notes (Signed)
   Covid-19 Vaccination Clinic  Name:  Terri Hurst    MRN: [id number] DOB: Jan 26, 1996  11/09/2019  [name] was observed post Covid-19 immunization for 15 minutes without incident. She was provided with Vaccine Information Sheet and instruction to access the V-Safe system.   [name] was instructed to call 911 with any severe reactions post vaccine: Marland Kitchen Difficulty breathing  . Swelling of face and throat  . A fast heartbeat  . A bad rash all over body  . Dizziness and weakness   Immunizations Administered    Name Date Dose VIS Date Route   Pfizer COVID-19 Vaccine 11/09/2019  2:30 PM 0.3 mL 07/27/2019 Intramuscular   Manufacturer: [employer], [employer]   Lot: [id number]   NDC: [address]-7

## 2019-12-04 ENCOUNTER — Ambulatory Visit: Payer: BC Managed Care – PPO | Attending: Internal Medicine

## 2019-12-04 DIAGNOSIS — Z23 Encounter for immunization: Secondary | ICD-10-CM

## 2019-12-04 NOTE — Progress Notes (Signed)
   Covid-19 Vaccination Clinic  Name:  Terri Hurst    MRN: [id number] DOB: April 10, 1996  12/04/2019  Ms. Terri Hurst was observed post Covid-19 immunization for 15 minutes without incident. She was provided with Vaccine Information Sheet and instruction to access the V-Safe system.   Terri Hurst was instructed to call 911 with any severe reactions post vaccine: Marland Kitchen Difficulty breathing  . Swelling of face and throat  . A fast heartbeat  . A bad rash all over body  . Dizziness and weakness   Immunizations Administered    Name Date Dose VIS Date Route   Pfizer COVID-19 Vaccine 12/04/2019  2:29 PM 0.3 mL 10/10/2018 Intramuscular   Manufacturer: [employer], [employer]   Lot: [id number]   NDC: [address]-3

## 2020-06-16 ENCOUNTER — Encounter: Payer: Self-pay | Admitting: Nurse Practitioner

## 2020-06-16 ENCOUNTER — Other Ambulatory Visit: Payer: Self-pay

## 2020-06-16 ENCOUNTER — Ambulatory Visit (INDEPENDENT_AMBULATORY_CARE_PROVIDER_SITE_OTHER): Payer: Self-pay | Admitting: Nurse Practitioner

## 2020-06-16 VITALS — BP 122/78 | Ht 66.5 in | Wt 135.0 lb

## 2020-06-16 DIAGNOSIS — Z01419 Encounter for gynecological examination (general) (routine) without abnormal findings: Secondary | ICD-10-CM

## 2020-06-16 DIAGNOSIS — Z3044 Encounter for surveillance of vaginal ring hormonal contraceptive device: Secondary | ICD-10-CM

## 2020-06-16 MED ORDER — ETONOGESTREL-ETHINYL ESTRADIOL 0.12-0.015 MG/24HR VA RING: VAGINAL_RING | VAGINAL | 4 refills | Status: DC

## 2020-06-16 NOTE — Progress Notes (Signed)
   Terri Hurst 23-Oct-1995 [id number]   History:  24 y.o. G0 presents for annual exam. Regular monthly cycle on Nuvaring. Gardasil series completed. History of right breast fibroadenoma.   Gynecologic History Patient's last menstrual period was 05/26/2020.   Contraception: NuvaRing vaginal inserts Last Pap: 04/2018. Results were: normal  Past medical history, past surgical history, family history and social history were all reviewed and documented in the EPIC chart.  ROS:  A ROS was performed and pertinent positives and negatives are included.  Exam:  Vitals:   06/16/20 1513  BP: 122/78  Weight: 135 lb (61.2 kg)  Height: 5' 6.5" (1.689 m)   Body mass index is 21.46 kg/m.  General appearance:  Normal Thyroid:  Symmetrical, normal in size, without palpable masses or nodularity. Respiratory  Auscultation:  Clear without wheezing or rhonchi Cardiovascular  Auscultation:  Regular rate, without rubs, murmurs or gallops  Edema/varicosities:  Not grossly evident Abdominal  Soft,nontender, without masses, guarding or rebound.  Liver/spleen:  No organomegaly noted  Hernia:  None appreciated  Skin  Inspection:  Grossly normal   Breasts: Examined lying and sitting.   Right: Without masses, retractions, discharge or axillary adenopathy.   Left: Without masses, retractions, discharge or axillary adenopathy. Gentitourinary   Inguinal/mons:  Normal without inguinal adenopathy  External genitalia:  Normal  BUS/Urethra/Skene's glands:  Normal  Vagina:  Normal  Cervix:  Normal  Uterus:  Normal in size, shape and contour.  Midline and mobile  Adnexa/parametria:     Rt: Without masses or tenderness.   Lt: Without masses or tenderness.  Anus and perineum: Normal  Assessment/Plan:  24 y.o. G0 for annual exam.   Well female exam with routine gynecological exam - Education provided on SBEs, importance of preventative screenings, current guidelines, high calcium diet, regular  exercise, and multivitamin daily.   Encounter for surveillance of vaginal ring hormonal contraceptive device - Plan: etonogestrel-ethinyl estradiol (NUVARING) 0.12-0.015 MG/24HR vaginal ring. Taking as prescribed. Having regular monthly cycles. Refill x 1 year provided.   Screening for cervical cancer - Normal pap history. Will repeat at 3-year interval per guidelines.   Follow up in 1 year for annual.     [facility], 3:20 PM 06/16/2020

## 2020-06-16 NOTE — Patient Instructions (Signed)
Health Maintenance, Female Adopting a healthy lifestyle and getting preventive care are important in promoting health and wellness. Ask your health care provider about:  The right schedule for you to have regular tests and exams.  Things you can do on your own to prevent diseases and keep yourself healthy. What should I know about diet, weight, and exercise? Eat a healthy diet   Eat a diet that includes plenty of vegetables, fruits, low-fat dairy products, and lean protein.  Do not eat a lot of foods that are high in solid fats, added sugars, or sodium. Maintain a healthy weight Body mass index (BMI) is used to identify weight problems. It estimates body fat based on height and weight. Your health care provider can help determine your BMI and help you achieve or maintain a healthy weight. Get regular exercise Get regular exercise. This is one of the most important things you can do for your health. Most adults should:  Exercise for at least 150 minutes each week. The exercise should increase your heart rate and make you sweat (moderate-intensity exercise).  Do strengthening exercises at least twice a week. This is in addition to the moderate-intensity exercise.  Spend less time sitting. Even light physical activity can be beneficial. Watch cholesterol and blood lipids Have your blood tested for lipids and cholesterol at 24 years of age, then have this test every 5 years. Have your cholesterol levels checked more often if:  Your lipid or cholesterol levels are high.  You are older than 24 years of age.  You are at high risk for heart disease. What should I know about cancer screening? Depending on your health history and family history, you may need to have cancer screening at various ages. This may include screening for:  Breast cancer.  Cervical cancer.  Colorectal cancer.  Skin cancer.  Lung cancer. What should I know about heart disease, diabetes, and high blood  pressure? Blood pressure and heart disease  High blood pressure causes heart disease and increases the risk of stroke. This is more likely to develop in people who have high blood pressure readings, are of African descent, or are overweight.  Have your blood pressure checked: ? Every 3-5 years if you are 18-39 years of age. ? Every year if you are 40 years old or older. Diabetes Have regular diabetes screenings. This checks your fasting blood sugar level. Have the screening done:  Once every three years after age 40 if you are at a normal weight and have a low risk for diabetes.  More often and at a younger age if you are overweight or have a high risk for diabetes. What should I know about preventing infection? Hepatitis B If you have a higher risk for hepatitis B, you should be screened for this virus. Talk with your health care provider to find out if you are at risk for hepatitis B infection. Hepatitis C Testing is recommended for:  Everyone born from 1945 through 1965.  Anyone with known risk factors for hepatitis C. Sexually transmitted infections (STIs)  Get screened for STIs, including gonorrhea and chlamydia, if: ? You are sexually active and are younger than 24 years of age. ? You are older than 24 years of age and your health care provider tells you that you are at risk for this type of infection. ? Your sexual activity has changed since you were last screened, and you are at increased risk for chlamydia or gonorrhea. Ask your health care provider if   you are at risk.  Ask your health care provider about whether you are at high risk for HIV. Your health care provider may recommend a prescription medicine to help prevent HIV infection. If you choose to take medicine to prevent HIV, you should first get tested for HIV. You should then be tested every 3 months for as long as you are taking the medicine. Pregnancy  If you are about to stop having your period (premenopausal) and  you may become pregnant, seek counseling before you get pregnant.  Take 400 to 800 micrograms (mcg) of folic acid every day if you become pregnant.  Ask for birth control (contraception) if you want to prevent pregnancy. Osteoporosis and menopause Osteoporosis is a disease in which the bones lose minerals and strength with aging. This can result in bone fractures. If you are 65 years old or older, or if you are at risk for osteoporosis and fractures, ask your health care provider if you should:  Be screened for bone loss.  Take a calcium or vitamin D supplement to lower your risk of fractures.  Be given hormone replacement therapy (HRT) to treat symptoms of menopause. Follow these instructions at home: Lifestyle  Do not use any products that contain nicotine or tobacco, such as cigarettes, e-cigarettes, and chewing tobacco. If you need help quitting, ask your health care provider.  Do not use street drugs.  Do not share needles.  Ask your health care provider for help if you need support or information about quitting drugs. Alcohol use  Do not drink alcohol if: ? Your health care provider tells you not to drink. ? You are pregnant, may be pregnant, or are planning to become pregnant.  If you drink alcohol: ? Limit how much you use to 0-1 drink a day. ? Limit intake if you are breastfeeding.  Be aware of how much alcohol is in your drink. In the [place] one drink equals one 12 oz bottle of beer (355 mL), one 5 oz glass of wine (148 mL), or one 1 oz glass of hard liquor (44 mL). General instructions  Schedule regular health, dental, and eye exams.  Stay current with your vaccines.  Tell your health care provider if: ? You often feel depressed. ? You have ever been abused or do not feel safe at home. Summary  Adopting a healthy lifestyle and getting preventive care are important in promoting health and wellness.  Follow your health care provider's instructions about healthy  diet, exercising, and getting tested or screened for diseases.  Follow your health care provider's instructions on monitoring your cholesterol and blood pressure. This information is not intended to replace advice given to you by your health care provider. Make sure you discuss any questions you have with your health care provider. Document Revised: 07/26/2018 Document Reviewed: 07/26/2018 [employer] Patient Education  2020 [employer]  

## 2020-11-13 ENCOUNTER — Other Ambulatory Visit: Payer: Self-pay

## 2020-11-13 DIAGNOSIS — Z3044 Encounter for surveillance of vaginal ring hormonal contraceptive device: Secondary | ICD-10-CM

## 2020-11-13 MED ORDER — ETONOGESTREL-ETHINYL ESTRADIOL 0.12-0.015 MG/24HR VA RING: VAGINAL_RING | VAGINAL | 2 refills | Status: AC

## 2021-06-17 ENCOUNTER — Ambulatory Visit: Payer: Self-pay | Admitting: Nurse Practitioner
